# Patient Record
Sex: Female | Born: 1970 | Race: White | Hispanic: No | Marital: Married
Health system: Southern US, Community
[De-identification: ages and names within clinical notes are randomized; demographics above are authoritative.]

## PROBLEM LIST (undated history)

## (undated) HISTORY — PX: BREAST CYST EXCISION: SHX579

---

## 2017-05-12 DIAGNOSIS — H2511 Age-related nuclear cataract, right eye: Secondary | ICD-10-CM | POA: Diagnosis not present

## 2017-05-12 DIAGNOSIS — H25011 Cortical age-related cataract, right eye: Secondary | ICD-10-CM | POA: Diagnosis not present

## 2017-05-12 DIAGNOSIS — H2513 Age-related nuclear cataract, bilateral: Secondary | ICD-10-CM | POA: Diagnosis not present

## 2017-05-12 DIAGNOSIS — H25043 Posterior subcapsular polar age-related cataract, bilateral: Secondary | ICD-10-CM | POA: Diagnosis not present

## 2017-05-12 DIAGNOSIS — H25013 Cortical age-related cataract, bilateral: Secondary | ICD-10-CM | POA: Diagnosis not present

## 2017-06-19 DIAGNOSIS — H2512 Age-related nuclear cataract, left eye: Secondary | ICD-10-CM | POA: Diagnosis not present

## 2017-06-19 DIAGNOSIS — H2511 Age-related nuclear cataract, right eye: Secondary | ICD-10-CM | POA: Diagnosis not present

## 2017-07-03 DIAGNOSIS — H2512 Age-related nuclear cataract, left eye: Secondary | ICD-10-CM | POA: Diagnosis not present

## 2017-08-20 DIAGNOSIS — Z794 Long term (current) use of insulin: Secondary | ICD-10-CM | POA: Diagnosis not present

## 2017-08-20 DIAGNOSIS — E1040 Type 1 diabetes mellitus with diabetic neuropathy, unspecified: Secondary | ICD-10-CM | POA: Diagnosis not present

## 2017-08-20 DIAGNOSIS — E1065 Type 1 diabetes mellitus with hyperglycemia: Secondary | ICD-10-CM | POA: Diagnosis not present

## 2017-08-23 DIAGNOSIS — Z794 Long term (current) use of insulin: Secondary | ICD-10-CM | POA: Diagnosis not present

## 2017-08-23 DIAGNOSIS — E1065 Type 1 diabetes mellitus with hyperglycemia: Secondary | ICD-10-CM | POA: Diagnosis not present

## 2017-09-16 DIAGNOSIS — E103593 Type 1 diabetes mellitus with proliferative diabetic retinopathy without macular edema, bilateral: Secondary | ICD-10-CM | POA: Diagnosis not present

## 2017-09-16 DIAGNOSIS — H43812 Vitreous degeneration, left eye: Secondary | ICD-10-CM | POA: Diagnosis not present

## 2017-09-16 DIAGNOSIS — H35371 Puckering of macula, right eye: Secondary | ICD-10-CM | POA: Diagnosis not present

## 2017-09-30 DIAGNOSIS — L700 Acne vulgaris: Secondary | ICD-10-CM | POA: Diagnosis not present

## 2017-10-26 DIAGNOSIS — R809 Proteinuria, unspecified: Secondary | ICD-10-CM | POA: Diagnosis not present

## 2017-12-04 DIAGNOSIS — E785 Hyperlipidemia, unspecified: Secondary | ICD-10-CM | POA: Diagnosis not present

## 2017-12-04 DIAGNOSIS — E1065 Type 1 diabetes mellitus with hyperglycemia: Secondary | ICD-10-CM | POA: Diagnosis not present

## 2017-12-04 DIAGNOSIS — E1040 Type 1 diabetes mellitus with diabetic neuropathy, unspecified: Secondary | ICD-10-CM | POA: Diagnosis not present

## 2017-12-04 DIAGNOSIS — Z794 Long term (current) use of insulin: Secondary | ICD-10-CM | POA: Diagnosis not present

## 2018-01-27 DIAGNOSIS — Z794 Long term (current) use of insulin: Secondary | ICD-10-CM | POA: Diagnosis not present

## 2018-01-27 DIAGNOSIS — E1143 Type 2 diabetes mellitus with diabetic autonomic (poly)neuropathy: Secondary | ICD-10-CM | POA: Diagnosis not present

## 2018-01-27 DIAGNOSIS — K3184 Gastroparesis: Secondary | ICD-10-CM | POA: Diagnosis not present

## 2018-02-23 DIAGNOSIS — L309 Dermatitis, unspecified: Secondary | ICD-10-CM | POA: Diagnosis not present

## 2018-03-31 DIAGNOSIS — Z23 Encounter for immunization: Secondary | ICD-10-CM | POA: Diagnosis not present

## 2018-04-29 DIAGNOSIS — E119 Type 2 diabetes mellitus without complications: Secondary | ICD-10-CM | POA: Diagnosis not present

## 2018-06-02 DIAGNOSIS — E1065 Type 1 diabetes mellitus with hyperglycemia: Secondary | ICD-10-CM | POA: Diagnosis not present

## 2018-06-02 DIAGNOSIS — E1040 Type 1 diabetes mellitus with diabetic neuropathy, unspecified: Secondary | ICD-10-CM | POA: Diagnosis not present

## 2018-06-02 DIAGNOSIS — Z794 Long term (current) use of insulin: Secondary | ICD-10-CM | POA: Diagnosis not present

## 2018-07-30 DIAGNOSIS — K3184 Gastroparesis: Secondary | ICD-10-CM | POA: Diagnosis not present

## 2018-07-30 DIAGNOSIS — N183 Chronic kidney disease, stage 3 (moderate): Secondary | ICD-10-CM | POA: Diagnosis not present

## 2018-07-30 DIAGNOSIS — E1040 Type 1 diabetes mellitus with diabetic neuropathy, unspecified: Secondary | ICD-10-CM | POA: Diagnosis not present

## 2018-09-16 DIAGNOSIS — L709 Acne, unspecified: Secondary | ICD-10-CM | POA: Diagnosis not present

## 2019-02-24 DIAGNOSIS — Z794 Long term (current) use of insulin: Secondary | ICD-10-CM | POA: Diagnosis not present

## 2019-02-24 DIAGNOSIS — K3184 Gastroparesis: Secondary | ICD-10-CM | POA: Diagnosis not present

## 2019-02-24 DIAGNOSIS — E1040 Type 1 diabetes mellitus with diabetic neuropathy, unspecified: Secondary | ICD-10-CM | POA: Diagnosis not present

## 2019-02-24 DIAGNOSIS — E10319 Type 1 diabetes mellitus with unspecified diabetic retinopathy without macular edema: Secondary | ICD-10-CM | POA: Diagnosis not present

## 2019-03-01 DIAGNOSIS — E1040 Type 1 diabetes mellitus with diabetic neuropathy, unspecified: Secondary | ICD-10-CM | POA: Diagnosis not present

## 2019-04-15 DIAGNOSIS — M79672 Pain in left foot: Secondary | ICD-10-CM | POA: Diagnosis not present

## 2019-04-28 DIAGNOSIS — Z01419 Encounter for gynecological examination (general) (routine) without abnormal findings: Secondary | ICD-10-CM | POA: Diagnosis not present

## 2019-10-25 DIAGNOSIS — E1040 Type 1 diabetes mellitus with diabetic neuropathy, unspecified: Secondary | ICD-10-CM | POA: Diagnosis not present

## 2019-10-25 DIAGNOSIS — K3184 Gastroparesis: Secondary | ICD-10-CM | POA: Diagnosis not present

## 2019-10-25 DIAGNOSIS — F411 Generalized anxiety disorder: Secondary | ICD-10-CM | POA: Diagnosis not present

## 2019-10-25 DIAGNOSIS — F3341 Major depressive disorder, recurrent, in partial remission: Secondary | ICD-10-CM | POA: Diagnosis not present

## 2019-12-09 DIAGNOSIS — S30860A Insect bite (nonvenomous) of lower back and pelvis, initial encounter: Secondary | ICD-10-CM | POA: Diagnosis not present

## 2020-01-23 ENCOUNTER — Ambulatory Visit
Admission: RE | Admit: 2020-01-23 | Discharge: 2020-01-23 | Disposition: A | Discharge status: Home / Self Care | Payer: BC Managed Care – PPO | Source: Ambulatory Visit | Attending: Internal Medicine | Admitting: Internal Medicine

## 2020-01-23 ENCOUNTER — Other Ambulatory Visit: Payer: Self-pay | Admitting: Internal Medicine

## 2020-01-23 DIAGNOSIS — S92512A Displaced fracture of proximal phalanx of left lesser toe(s), initial encounter for closed fracture: Secondary | ICD-10-CM | POA: Diagnosis not present

## 2020-01-23 DIAGNOSIS — E1040 Type 1 diabetes mellitus with diabetic neuropathy, unspecified: Secondary | ICD-10-CM | POA: Diagnosis not present

## 2020-01-23 DIAGNOSIS — Z794 Long term (current) use of insulin: Secondary | ICD-10-CM | POA: Diagnosis not present

## 2020-01-23 DIAGNOSIS — S99922A Unspecified injury of left foot, initial encounter: Secondary | ICD-10-CM

## 2020-01-23 DIAGNOSIS — K3184 Gastroparesis: Secondary | ICD-10-CM | POA: Diagnosis not present

## 2020-01-23 DIAGNOSIS — E10319 Type 1 diabetes mellitus with unspecified diabetic retinopathy without macular edema: Secondary | ICD-10-CM | POA: Diagnosis not present

## 2020-02-27 ENCOUNTER — Other Ambulatory Visit: Payer: Self-pay

## 2020-02-27 ENCOUNTER — Ambulatory Visit: Payer: BC Managed Care – PPO | Admitting: Podiatry

## 2020-02-27 ENCOUNTER — Ambulatory Visit (INDEPENDENT_AMBULATORY_CARE_PROVIDER_SITE_OTHER): Payer: BC Managed Care – PPO

## 2020-02-27 ENCOUNTER — Other Ambulatory Visit: Payer: Self-pay | Admitting: Podiatry

## 2020-02-27 DIAGNOSIS — S92501A Displaced unspecified fracture of right lesser toe(s), initial encounter for closed fracture: Secondary | ICD-10-CM | POA: Diagnosis not present

## 2020-02-27 DIAGNOSIS — M7989 Other specified soft tissue disorders: Secondary | ICD-10-CM

## 2020-02-27 DIAGNOSIS — M79672 Pain in left foot: Secondary | ICD-10-CM

## 2020-02-27 DIAGNOSIS — T148XXA Other injury of unspecified body region, initial encounter: Secondary | ICD-10-CM

## 2020-02-27 NOTE — Patient Instructions (Signed)
Toe Fracture A toe fracture is a break in one of the toe bones (phalanges). This may happen if you:  Drop a heavy object on your toe.  Stub your toe.  Twist your toe.  Exercise the same way too much. What are the signs or symptoms? The main symptoms are swelling and pain in the toe. You may also have:  Bruising.  Stiffness.  Numbness.  A change in the way the toe looks.  Broken bones that poke through the skin.  Blood under the toenail. How is this treated? Treatments may include:  Taping the broken toe to a toe that is next to it (buddy taping).  Wearing a shoe that has a wide, rigid sole to protect the toe and to limit its movement.  Wearing a cast.  Surgery. This may be needed if the: ? Pieces of broken bone are out of place. ? Bone pokes through the skin.  Physical therapy. Follow these instructions at home: If you have a shoe:  Wear the shoe as told by your doctor. Remove it only as told by your doctor.  Loosen the shoe if your toes tingle, become numb, or turn cold and blue.  Keep the shoe clean and dry. If you have a cast:  Do not put pressure on any part of the cast until it is fully hardened. This may take a few hours.  Do not stick anything inside the cast to scratch your skin.  Check the skin around the cast every day. Tell your doctor about any concerns.  You may put lotion on dry skin around the edges of the cast.  Do not put lotion on the skin under the cast.  Keep the cast clean and dry. Bathing  Do not take baths, swim, or use a hot tub until your doctor says it is okay. Ask your doctor if you can take showers.  If the shoe or cast is not waterproof: ? Do not let it get wet. ? Cover it with a watertight covering when you take a bath or a shower. Activity  Do not use your foot to support your body weight until your doctor says it is okay.  Use crutches as told by your doctor.  Ask your doctor what activities are safe for you  during recovery.  Avoid activities as told by your doctor.  Do exercises as told by your doctor or therapist. Driving  Do not drive or use heavy machinery while taking pain medicine.  Do not drive while wearing a cast on a foot that you use for driving. Managing pain, stiffness, and swelling   Put ice on the injured area if told by your doctor: ? Put ice in a plastic bag. ? Place a towel between your skin and the bag.  If you have a shoe, remove it as told by your doctor.  If you have a cast, place a towel between your cast and the bag. ? Leave the ice on for 20 minutes, 2-3 times per day.  Raise (elevate) the injured area above the level of your heart while you are sitting or lying down. General instructions  If your toe was taped to a toe that is next to it, follow your doctor's instructions for changing the gauze and tape. Change it more often: ? If the gauze and tape get wet. If this happens, dry the space between the toes. ? If the gauze and tape are too tight and they cause your toe to become pale   or to lose feeling (go numb).  If your doctor did not give you a protective shoe, wear sturdy shoes that support your foot. Your shoes should not: ? Pinch your toes. ? Fit tightly against your toes.  Do not use any tobacco products, including cigarettes, chewing tobacco, or e-cigarettes. These can delay bone healing. If you need help quitting, ask your doctor.  Take medicines only as told by your doctor.  Keep all follow-up visits as told by your doctor. This is important. Contact a doctor if:  Your pain medicine is not helping.  You have a fever.  You notice a bad smell coming from your cast. Get help right away if:  You lose feeling (have numbness) in your toe or foot, and it is getting worse.  Your toe or your foot tingles.  Your toe or your foot gets cold or turns blue.  You have redness or swelling in your toe or foot, and it is getting worse.  You have very  bad pain. Summary  A toe fracture is a break in one of the toe bones.  Use ice and raise your foot. This will help lessen pain and swelling.  Use crutches as told by your doctor. This information is not intended to replace advice given to you by your health care provider. Make sure you discuss any questions you have with your health care provider. Document Revised: 08/06/2017 Document Reviewed: 07/14/2017 Elsevier Patient Education  2020 [employer]  

## 2020-03-02 NOTE — Progress Notes (Signed)
Subjective:   Patient ID: Stacey Lester, female   DOB: 49 y.o.   MRN: [id number]   HPI 49 year old female presents the office today for concerns of left foot pain, swelling in particular she broke her fifth toe about a month ago.  She states that she is having swelling to the fifth toe.  In general she does get intermittent swelling to the left foot and ankle which is been ongoing for last several years.  She does have a history of fracture of the metatarsals 2 through 4 she reports as well to the fifth metatarsal for which she has had surgery.  No swelling to the leg.   Review of Systems  All other systems reviewed and are negative.  No past medical history on file.  No current outpatient medications on file.  Not on File       Objective:  Physical Exam  General: AAO x3, NAD  Dermatological: Skin is warm, dry and supple bilateral. There are no open sores, no preulcerative lesions, no rash or signs of infection present.  Vascular: Dorsalis Pedis artery and Posterior Tibial artery pedal pulses are 2/4 bilateral with immedate capillary fill time. There is no pain with calf compression, swelling, warmth, erythema.   Neruologic: Grossly intact via light touch bilateral  Musculoskeletal: There is edema and mild tenderness to the fifth toe.  There is some edema present left foot compared to contralateral extremity but there is no erythema or warmth.  There is no pain with ankle joint range of motion or subtalar.  Flexor, extensor tendons appear to be intact.  MMT 5/5.  Gait: Unassisted, Nonantalgic.       Assessment:   Fifth toe fracture, intermittent left foot swelling     Plan:  -Treatment options discussed including all alternatives, risks, and complications -Etiology of symptoms were discussed -X-rays were obtained and reviewed with the patient.  Comminuted fracture present of the fifth digit with some evidence of healing. -As point discussed with her to wear stiffer soled  shoe to allow the toe to continue to heal.  Discussed at some point she may need to have surgical intervention if the toe becomes symptomatic long-term. -Regards to swelling I measured in the calf and the ankles is seen bilaterally.  She does note intermittent swelling to the left foot.  This could be resulted in numerous injuries to the foot.  Will allow the fifth toe to heal and further evaluate the swelling.    Vivi Barrack DPM

## 2020-04-30 DIAGNOSIS — Z794 Long term (current) use of insulin: Secondary | ICD-10-CM | POA: Diagnosis not present

## 2020-04-30 DIAGNOSIS — F3341 Major depressive disorder, recurrent, in partial remission: Secondary | ICD-10-CM | POA: Diagnosis not present

## 2020-04-30 DIAGNOSIS — Z0000 Encounter for general adult medical examination without abnormal findings: Secondary | ICD-10-CM | POA: Diagnosis not present

## 2020-04-30 DIAGNOSIS — Z23 Encounter for immunization: Secondary | ICD-10-CM | POA: Diagnosis not present

## 2020-04-30 DIAGNOSIS — Z1211 Encounter for screening for malignant neoplasm of colon: Secondary | ICD-10-CM | POA: Diagnosis not present

## 2020-04-30 DIAGNOSIS — R809 Proteinuria, unspecified: Secondary | ICD-10-CM | POA: Diagnosis not present

## 2020-04-30 DIAGNOSIS — E10319 Type 1 diabetes mellitus with unspecified diabetic retinopathy without macular edema: Secondary | ICD-10-CM | POA: Diagnosis not present

## 2020-05-02 DIAGNOSIS — N183 Chronic kidney disease, stage 3 unspecified: Secondary | ICD-10-CM | POA: Diagnosis not present

## 2020-05-02 DIAGNOSIS — E10319 Type 1 diabetes mellitus with unspecified diabetic retinopathy without macular edema: Secondary | ICD-10-CM | POA: Diagnosis not present

## 2020-05-02 DIAGNOSIS — E785 Hyperlipidemia, unspecified: Secondary | ICD-10-CM | POA: Diagnosis not present

## 2020-05-02 DIAGNOSIS — Z0000 Encounter for general adult medical examination without abnormal findings: Secondary | ICD-10-CM | POA: Diagnosis not present

## 2020-05-14 DIAGNOSIS — N393 Stress incontinence (female) (male): Secondary | ICD-10-CM | POA: Diagnosis not present

## 2020-05-14 DIAGNOSIS — R232 Flushing: Secondary | ICD-10-CM | POA: Diagnosis not present

## 2020-05-14 DIAGNOSIS — Z01419 Encounter for gynecological examination (general) (routine) without abnormal findings: Secondary | ICD-10-CM | POA: Diagnosis not present

## 2020-05-29 DIAGNOSIS — H43812 Vitreous degeneration, left eye: Secondary | ICD-10-CM | POA: Diagnosis not present

## 2020-05-29 DIAGNOSIS — H26493 Other secondary cataract, bilateral: Secondary | ICD-10-CM | POA: Diagnosis not present

## 2020-05-29 DIAGNOSIS — H35371 Puckering of macula, right eye: Secondary | ICD-10-CM | POA: Diagnosis not present

## 2020-05-29 DIAGNOSIS — E103593 Type 1 diabetes mellitus with proliferative diabetic retinopathy without macular edema, bilateral: Secondary | ICD-10-CM | POA: Diagnosis not present

## 2020-06-28 DIAGNOSIS — R0602 Shortness of breath: Secondary | ICD-10-CM | POA: Diagnosis not present

## 2020-06-28 DIAGNOSIS — R0981 Nasal congestion: Secondary | ICD-10-CM | POA: Diagnosis not present

## 2020-09-10 DIAGNOSIS — Z01812 Encounter for preprocedural laboratory examination: Secondary | ICD-10-CM | POA: Diagnosis not present

## 2020-09-13 DIAGNOSIS — K648 Other hemorrhoids: Secondary | ICD-10-CM | POA: Diagnosis not present

## 2020-09-13 DIAGNOSIS — K644 Residual hemorrhoidal skin tags: Secondary | ICD-10-CM | POA: Diagnosis not present

## 2020-09-13 DIAGNOSIS — Z1211 Encounter for screening for malignant neoplasm of colon: Secondary | ICD-10-CM | POA: Diagnosis not present

## 2020-09-13 DIAGNOSIS — K589 Irritable bowel syndrome without diarrhea: Secondary | ICD-10-CM | POA: Diagnosis not present

## 2020-09-13 DIAGNOSIS — K635 Polyp of colon: Secondary | ICD-10-CM | POA: Diagnosis not present

## 2020-09-13 DIAGNOSIS — Q438 Other specified congenital malformations of intestine: Secondary | ICD-10-CM | POA: Diagnosis not present

## 2020-10-02 ENCOUNTER — Other Ambulatory Visit: Payer: Self-pay | Admitting: Obstetrics and Gynecology

## 2020-10-02 DIAGNOSIS — Z1231 Encounter for screening mammogram for malignant neoplasm of breast: Secondary | ICD-10-CM

## 2020-10-11 ENCOUNTER — Ambulatory Visit
Admission: RE | Admit: 2020-10-11 | Discharge: 2020-10-11 | Disposition: A | Discharge status: Home / Self Care | Payer: BC Managed Care – PPO | Source: Ambulatory Visit | Attending: Internal Medicine | Admitting: Internal Medicine

## 2020-10-11 ENCOUNTER — Other Ambulatory Visit: Payer: Self-pay | Admitting: Internal Medicine

## 2020-10-11 DIAGNOSIS — Z794 Long term (current) use of insulin: Secondary | ICD-10-CM | POA: Diagnosis not present

## 2020-10-11 DIAGNOSIS — Z87891 Personal history of nicotine dependence: Secondary | ICD-10-CM

## 2020-10-11 DIAGNOSIS — E10319 Type 1 diabetes mellitus with unspecified diabetic retinopathy without macular edema: Secondary | ICD-10-CM | POA: Diagnosis not present

## 2020-10-11 DIAGNOSIS — R0602 Shortness of breath: Secondary | ICD-10-CM

## 2020-10-11 DIAGNOSIS — E1040 Type 1 diabetes mellitus with diabetic neuropathy, unspecified: Secondary | ICD-10-CM | POA: Diagnosis not present

## 2020-10-11 DIAGNOSIS — K3184 Gastroparesis: Secondary | ICD-10-CM | POA: Diagnosis not present

## 2020-10-15 DIAGNOSIS — E1040 Type 1 diabetes mellitus with diabetic neuropathy, unspecified: Secondary | ICD-10-CM | POA: Diagnosis not present

## 2020-10-15 DIAGNOSIS — M6281 Muscle weakness (generalized): Secondary | ICD-10-CM | POA: Diagnosis not present

## 2020-10-22 DIAGNOSIS — E1040 Type 1 diabetes mellitus with diabetic neuropathy, unspecified: Secondary | ICD-10-CM | POA: Diagnosis not present

## 2020-10-22 DIAGNOSIS — E559 Vitamin D deficiency, unspecified: Secondary | ICD-10-CM | POA: Diagnosis not present

## 2020-10-22 DIAGNOSIS — N183 Chronic kidney disease, stage 3 unspecified: Secondary | ICD-10-CM | POA: Diagnosis not present

## 2020-10-22 DIAGNOSIS — K3184 Gastroparesis: Secondary | ICD-10-CM | POA: Diagnosis not present

## 2020-10-22 DIAGNOSIS — R809 Proteinuria, unspecified: Secondary | ICD-10-CM | POA: Diagnosis not present

## 2020-10-22 DIAGNOSIS — F411 Generalized anxiety disorder: Secondary | ICD-10-CM | POA: Diagnosis not present

## 2020-10-22 DIAGNOSIS — R5383 Other fatigue: Secondary | ICD-10-CM | POA: Diagnosis not present

## 2020-10-22 DIAGNOSIS — F3341 Major depressive disorder, recurrent, in partial remission: Secondary | ICD-10-CM | POA: Diagnosis not present

## 2020-11-21 ENCOUNTER — Other Ambulatory Visit: Payer: Self-pay

## 2020-11-21 ENCOUNTER — Ambulatory Visit
Admission: RE | Admit: 2020-11-21 | Discharge: 2020-11-21 | Disposition: A | Discharge status: Home / Self Care | Payer: BC Managed Care – PPO | Source: Ambulatory Visit | Attending: Obstetrics and Gynecology | Admitting: Obstetrics and Gynecology

## 2020-11-21 DIAGNOSIS — Z1231 Encounter for screening mammogram for malignant neoplasm of breast: Secondary | ICD-10-CM | POA: Diagnosis not present

## 2020-12-27 DIAGNOSIS — H18413 Arcus senilis, bilateral: Secondary | ICD-10-CM | POA: Diagnosis not present

## 2020-12-27 DIAGNOSIS — H26491 Other secondary cataract, right eye: Secondary | ICD-10-CM | POA: Diagnosis not present

## 2020-12-27 DIAGNOSIS — Z961 Presence of intraocular lens: Secondary | ICD-10-CM | POA: Diagnosis not present

## 2020-12-27 DIAGNOSIS — H53032 Strabismic amblyopia, left eye: Secondary | ICD-10-CM | POA: Diagnosis not present

## 2020-12-27 DIAGNOSIS — H26493 Other secondary cataract, bilateral: Secondary | ICD-10-CM | POA: Diagnosis not present

## 2021-01-18 DIAGNOSIS — B370 Candidal stomatitis: Secondary | ICD-10-CM | POA: Diagnosis not present

## 2021-02-11 DIAGNOSIS — U071 COVID-19: Secondary | ICD-10-CM | POA: Diagnosis not present

## 2021-02-11 DIAGNOSIS — E785 Hyperlipidemia, unspecified: Secondary | ICD-10-CM | POA: Diagnosis not present

## 2021-02-11 DIAGNOSIS — E1040 Type 1 diabetes mellitus with diabetic neuropathy, unspecified: Secondary | ICD-10-CM | POA: Diagnosis not present

## 2021-04-01 DIAGNOSIS — K1230 Oral mucositis (ulcerative), unspecified: Secondary | ICD-10-CM | POA: Diagnosis not present

## 2021-05-15 DIAGNOSIS — N951 Menopausal and female climacteric states: Secondary | ICD-10-CM | POA: Diagnosis not present

## 2021-05-15 DIAGNOSIS — Z01419 Encounter for gynecological examination (general) (routine) without abnormal findings: Secondary | ICD-10-CM | POA: Diagnosis not present

## 2021-05-30 ENCOUNTER — Other Ambulatory Visit: Payer: Self-pay | Admitting: Family Medicine

## 2021-05-30 ENCOUNTER — Other Ambulatory Visit: Payer: Self-pay

## 2021-05-30 ENCOUNTER — Ambulatory Visit
Admission: RE | Admit: 2021-05-30 | Discharge: 2021-05-30 | Disposition: A | Discharge status: Home / Self Care | Payer: BC Managed Care – PPO | Source: Ambulatory Visit | Attending: Family Medicine | Admitting: Family Medicine

## 2021-05-30 DIAGNOSIS — R52 Pain, unspecified: Secondary | ICD-10-CM

## 2021-05-30 DIAGNOSIS — M79672 Pain in left foot: Secondary | ICD-10-CM | POA: Diagnosis not present

## 2021-06-06 DIAGNOSIS — R809 Proteinuria, unspecified: Secondary | ICD-10-CM | POA: Diagnosis not present

## 2021-06-06 DIAGNOSIS — K3184 Gastroparesis: Secondary | ICD-10-CM | POA: Diagnosis not present

## 2021-06-07 ENCOUNTER — Ambulatory Visit (INDEPENDENT_AMBULATORY_CARE_PROVIDER_SITE_OTHER): Payer: BC Managed Care – PPO | Admitting: Podiatry

## 2021-06-07 DIAGNOSIS — S92515A Nondisplaced fracture of proximal phalanx of left lesser toe(s), initial encounter for closed fracture: Secondary | ICD-10-CM

## 2021-06-07 DIAGNOSIS — S93402A Sprain of unspecified ligament of left ankle, initial encounter: Secondary | ICD-10-CM | POA: Diagnosis not present

## 2021-06-12 NOTE — Progress Notes (Signed)
Subjective: 50 year old female presents the office today for concerns of toe injury in which she broke her left fifth toe.  She recently seen at her primary care physician's office and x-rays were performed of the foot which revealed a fracture of the fifth toe.  She has not had any immobilization and she presents today for follow-up.  She does get some discomfort of the ankle.  Objective: AAO x3, NAD DP/PT pulses palpable bilaterally, CRT less than 3 seconds Majority tenderness is localized along the fifth digit of the left foot.  No other discomfort of the digits or metatarsals.  There is a mild discomfort on the distal portion of the fibula and along the lateral ankle complex.  No significant instability present to the ankle at this time.  MMT 5/5. No pain with calf compression, swelling, warmth, erythema  Assessment: Left fifth toe fracture, rule out ankle injury  Plan: -All treatment options discussed with the patient including all alternatives, risks, complications.  -I was able to read the report from the x-rays which revealed a fracture of the fifth toe.  I would order x-rays of the ankle as well given the ankle discomfort.  For now immobilization in cam boot. -Patient encouraged to call the office with any questions, concerns, change in symptoms.   *Appointment there was a power outage and the patient still wanted to be seen.  [facility] was also closed for the holiday could not get x-rays today.  We will then place her into a cam boot and plan to get x-rays next week.  If symptoms worsen let me know.  Vivi Barrack DPM

## 2021-06-25 ENCOUNTER — Other Ambulatory Visit: Payer: Self-pay

## 2021-06-25 ENCOUNTER — Ambulatory Visit (INDEPENDENT_AMBULATORY_CARE_PROVIDER_SITE_OTHER): Payer: BC Managed Care – PPO

## 2021-06-25 ENCOUNTER — Ambulatory Visit: Payer: BC Managed Care – PPO | Admitting: Podiatry

## 2021-06-25 DIAGNOSIS — M7989 Other specified soft tissue disorders: Secondary | ICD-10-CM

## 2021-06-25 DIAGNOSIS — S92515D Nondisplaced fracture of proximal phalanx of left lesser toe(s), subsequent encounter for fracture with routine healing: Secondary | ICD-10-CM

## 2021-06-25 DIAGNOSIS — S92342A Displaced fracture of fourth metatarsal bone, left foot, initial encounter for closed fracture: Secondary | ICD-10-CM

## 2021-06-25 NOTE — Progress Notes (Signed)
Subjective: 51 year old female presents the office today with her husband for follow-up evaluation of a fracture to her big toe, second toe.  She states that she is still in the cam boot.  She has been some tenderness in the ball of her foot as well as the top of her foot she is noticing swelling.  No recent injury or changes otherwise since I last saw her.  Objective: AAO x3, NAD DP/PT pulses palpable bilaterally, CRT less than 3 seconds Mild discomfort present to the hallux as well as the second toe.  The majority discomfort is mostly along the MPJs and along the lesser MPJ on the dorsal aspect of this area.  There is mild edema present to the dorsal forefoot there is no associated erythema or warmth.  There is no pain in the medial ankle.  No discomfort along the ATFL lateral ankle.  No significant increase in anterior drawer or talar tilt test.  Flexor, extensor tendons appear to be intact.  No pain with calf compression, swelling, warmth, erythema  Assessment: Metatarsal fractures, digital fractures left foot; ankle sprain  Plan: -All treatment options discussed with the patient including all alternatives, risks, complications.  -X-rays obtained and reviewed.  Fracture still noted on the hallux as well as second digit.  There is evidence of fracture to the second, fourth metatarsal necks.  No evidence of fracture to the ankle. -Given continued discomfort as well as x-ray findings recommended continued mobilization in cam boot.  Continue to ice and elevate. -Patient encouraged to call the office with any questions, concerns, change in symptoms.   Trula Slade DPM

## 2021-07-11 ENCOUNTER — Ambulatory Visit: Payer: BC Managed Care – PPO

## 2021-07-11 ENCOUNTER — Other Ambulatory Visit: Payer: Self-pay

## 2021-07-11 ENCOUNTER — Ambulatory Visit: Payer: BC Managed Care – PPO | Admitting: Podiatry

## 2021-07-11 DIAGNOSIS — S92515D Nondisplaced fracture of proximal phalanx of left lesser toe(s), subsequent encounter for fracture with routine healing: Secondary | ICD-10-CM | POA: Diagnosis not present

## 2021-07-11 DIAGNOSIS — S92345D Nondisplaced fracture of fourth metatarsal bone, left foot, subsequent encounter for fracture with routine healing: Secondary | ICD-10-CM | POA: Diagnosis not present

## 2021-07-11 DIAGNOSIS — M25572 Pain in left ankle and joints of left foot: Secondary | ICD-10-CM

## 2021-07-14 NOTE — Progress Notes (Signed)
Subjective: 51 year old female presents the office today for evaluation of fracture to her right toes, metatarsals.  She can wear the cam boot.  Can get some swelling still in some discomfort but overall she states that she is doing better.  Pain level is 1/10.  She does not wear the cam boot all the time.  No recent injury or changes otherwise since I last saw her.  Objective: AAO x3, NAD DP/PT pulses palpable bilaterally, CRT less than 3 seconds On today's exam unable to elicit any area of pinpoint tenderness.  Some mild edema still present.  Flexor, extensor tendons appear to be intact.  There is no significant pain of the ankle.  No pain to the lateral ankle complex and there is no instability noted.  MMT 5/5. No pain with calf compression, swelling, warmth, erythema  Assessment: Metatarsal, digital fractures left foot  Plan: -All treatment options discussed with the patient including all alternatives, risks, complications.  -Repeat x-rays obtained reviewed.  Healing fractures noted to the second and third metatarsal necks.  Fraction of the second digit. -At this point I remain in the cam boot but as she has no pain she can start to transition gradually into regular shoe as tolerated.  Continue with compression to help with any swelling.  Continue ice it as well. -Patient encouraged to call the office with any questions, concerns, change in symptoms.   Return in about 4 weeks (around 08/08/2021) for fracture left foot, x-ray.  Vivi Barrack DPM

## 2021-07-18 DIAGNOSIS — N951 Menopausal and female climacteric states: Secondary | ICD-10-CM | POA: Diagnosis not present

## 2021-08-09 ENCOUNTER — Ambulatory Visit: Payer: BC Managed Care – PPO | Admitting: Podiatry

## 2021-10-15 DIAGNOSIS — H4312 Vitreous hemorrhage, left eye: Secondary | ICD-10-CM | POA: Diagnosis not present

## 2021-10-15 DIAGNOSIS — E103593 Type 1 diabetes mellitus with proliferative diabetic retinopathy without macular edema, bilateral: Secondary | ICD-10-CM | POA: Diagnosis not present

## 2021-10-15 DIAGNOSIS — H26493 Other secondary cataract, bilateral: Secondary | ICD-10-CM | POA: Diagnosis not present

## 2021-10-15 DIAGNOSIS — E1040 Type 1 diabetes mellitus with diabetic neuropathy, unspecified: Secondary | ICD-10-CM | POA: Diagnosis not present

## 2021-10-15 DIAGNOSIS — H35371 Puckering of macula, right eye: Secondary | ICD-10-CM | POA: Diagnosis not present

## 2021-10-15 DIAGNOSIS — Z794 Long term (current) use of insulin: Secondary | ICD-10-CM | POA: Diagnosis not present

## 2021-10-15 DIAGNOSIS — K3184 Gastroparesis: Secondary | ICD-10-CM | POA: Diagnosis not present

## 2021-10-17 DIAGNOSIS — E113592 Type 2 diabetes mellitus with proliferative diabetic retinopathy without macular edema, left eye: Secondary | ICD-10-CM | POA: Diagnosis not present

## 2021-10-17 DIAGNOSIS — H4312 Vitreous hemorrhage, left eye: Secondary | ICD-10-CM | POA: Diagnosis not present

## 2021-10-17 DIAGNOSIS — E103592 Type 1 diabetes mellitus with proliferative diabetic retinopathy without macular edema, left eye: Secondary | ICD-10-CM | POA: Diagnosis not present

## 2021-10-17 DIAGNOSIS — H26492 Other secondary cataract, left eye: Secondary | ICD-10-CM | POA: Diagnosis not present

## 2021-10-18 DIAGNOSIS — E103592 Type 1 diabetes mellitus with proliferative diabetic retinopathy without macular edema, left eye: Secondary | ICD-10-CM | POA: Diagnosis not present

## 2021-10-18 DIAGNOSIS — H31092 Other chorioretinal scars, left eye: Secondary | ICD-10-CM | POA: Diagnosis not present

## 2021-10-25 DIAGNOSIS — E103592 Type 1 diabetes mellitus with proliferative diabetic retinopathy without macular edema, left eye: Secondary | ICD-10-CM | POA: Diagnosis not present

## 2021-10-25 DIAGNOSIS — H31092 Other chorioretinal scars, left eye: Secondary | ICD-10-CM | POA: Diagnosis not present

## 2021-10-25 DIAGNOSIS — H4312 Vitreous hemorrhage, left eye: Secondary | ICD-10-CM | POA: Diagnosis not present

## 2021-10-28 DIAGNOSIS — R0781 Pleurodynia: Secondary | ICD-10-CM | POA: Diagnosis not present

## 2021-10-28 DIAGNOSIS — K3184 Gastroparesis: Secondary | ICD-10-CM | POA: Diagnosis not present

## 2021-10-29 DIAGNOSIS — N951 Menopausal and female climacteric states: Secondary | ICD-10-CM | POA: Diagnosis not present

## 2022-01-23 DIAGNOSIS — N951 Menopausal and female climacteric states: Secondary | ICD-10-CM | POA: Diagnosis not present

## 2022-04-03 DIAGNOSIS — N951 Menopausal and female climacteric states: Secondary | ICD-10-CM | POA: Diagnosis not present

## 2022-05-19 DIAGNOSIS — Z01419 Encounter for gynecological examination (general) (routine) without abnormal findings: Secondary | ICD-10-CM | POA: Diagnosis not present

## 2022-05-23 DIAGNOSIS — F411 Generalized anxiety disorder: Secondary | ICD-10-CM | POA: Diagnosis not present

## 2022-05-23 DIAGNOSIS — Z0000 Encounter for general adult medical examination without abnormal findings: Secondary | ICD-10-CM | POA: Diagnosis not present

## 2022-05-23 DIAGNOSIS — K3184 Gastroparesis: Secondary | ICD-10-CM | POA: Diagnosis not present

## 2022-05-23 DIAGNOSIS — E785 Hyperlipidemia, unspecified: Secondary | ICD-10-CM | POA: Diagnosis not present

## 2022-05-23 DIAGNOSIS — E559 Vitamin D deficiency, unspecified: Secondary | ICD-10-CM | POA: Diagnosis not present

## 2022-05-23 DIAGNOSIS — N1831 Chronic kidney disease, stage 3a: Secondary | ICD-10-CM | POA: Diagnosis not present

## 2022-05-23 DIAGNOSIS — F3341 Major depressive disorder, recurrent, in partial remission: Secondary | ICD-10-CM | POA: Diagnosis not present

## 2022-05-23 DIAGNOSIS — E1040 Type 1 diabetes mellitus with diabetic neuropathy, unspecified: Secondary | ICD-10-CM | POA: Diagnosis not present

## 2022-07-24 DIAGNOSIS — E103593 Type 1 diabetes mellitus with proliferative diabetic retinopathy without macular edema, bilateral: Secondary | ICD-10-CM | POA: Diagnosis not present

## 2022-07-24 DIAGNOSIS — H35373 Puckering of macula, bilateral: Secondary | ICD-10-CM | POA: Diagnosis not present

## 2022-07-24 DIAGNOSIS — H59813 Chorioretinal scars after surgery for detachment, bilateral: Secondary | ICD-10-CM | POA: Diagnosis not present

## 2022-08-15 DIAGNOSIS — M79644 Pain in right finger(s): Secondary | ICD-10-CM | POA: Diagnosis not present

## 2022-11-04 DIAGNOSIS — M79644 Pain in right finger(s): Secondary | ICD-10-CM | POA: Diagnosis not present

## 2022-12-19 DIAGNOSIS — M65311 Trigger thumb, right thumb: Secondary | ICD-10-CM | POA: Diagnosis not present

## 2022-12-19 DIAGNOSIS — S62501A Fracture of unspecified phalanx of right thumb, initial encounter for closed fracture: Secondary | ICD-10-CM | POA: Diagnosis not present

## 2023-02-05 DIAGNOSIS — Z794 Long term (current) use of insulin: Secondary | ICD-10-CM | POA: Diagnosis not present

## 2023-02-05 DIAGNOSIS — E1040 Type 1 diabetes mellitus with diabetic neuropathy, unspecified: Secondary | ICD-10-CM | POA: Diagnosis not present

## 2023-02-05 DIAGNOSIS — K3184 Gastroparesis: Secondary | ICD-10-CM | POA: Diagnosis not present

## 2023-04-27 DIAGNOSIS — H59813 Chorioretinal scars after surgery for detachment, bilateral: Secondary | ICD-10-CM | POA: Diagnosis not present

## 2023-04-27 DIAGNOSIS — H35373 Puckering of macula, bilateral: Secondary | ICD-10-CM | POA: Diagnosis not present

## 2023-04-27 DIAGNOSIS — E103593 Type 1 diabetes mellitus with proliferative diabetic retinopathy without macular edema, bilateral: Secondary | ICD-10-CM | POA: Diagnosis not present

## 2023-05-08 DIAGNOSIS — E1040 Type 1 diabetes mellitus with diabetic neuropathy, unspecified: Secondary | ICD-10-CM | POA: Diagnosis not present

## 2023-05-08 DIAGNOSIS — R110 Nausea: Secondary | ICD-10-CM | POA: Diagnosis not present

## 2023-05-08 DIAGNOSIS — Z794 Long term (current) use of insulin: Secondary | ICD-10-CM | POA: Diagnosis not present

## 2023-05-08 DIAGNOSIS — K3184 Gastroparesis: Secondary | ICD-10-CM | POA: Diagnosis not present

## 2023-05-22 DIAGNOSIS — Z01419 Encounter for gynecological examination (general) (routine) without abnormal findings: Secondary | ICD-10-CM | POA: Diagnosis not present

## 2023-06-07 IMAGING — MG MM DIGITAL SCREENING BILAT W/ TOMO AND CAD
8 series · 8 of 24 positions shown · non-contrast
Comparison: None.

CLINICAL DATA: Screening.

EXAM:
DIGITAL SCREENING BILATERAL MAMMOGRAM WITH TOMOSYNTHESIS AND CAD
TECHNIQUE: Bilateral screening digital craniocaudal and mediolateral oblique
mammograms were obtained. Bilateral screening digital breast
tomosynthesis was performed. The images were evaluated with
computer-aided detection.

[R CC synth-2D]
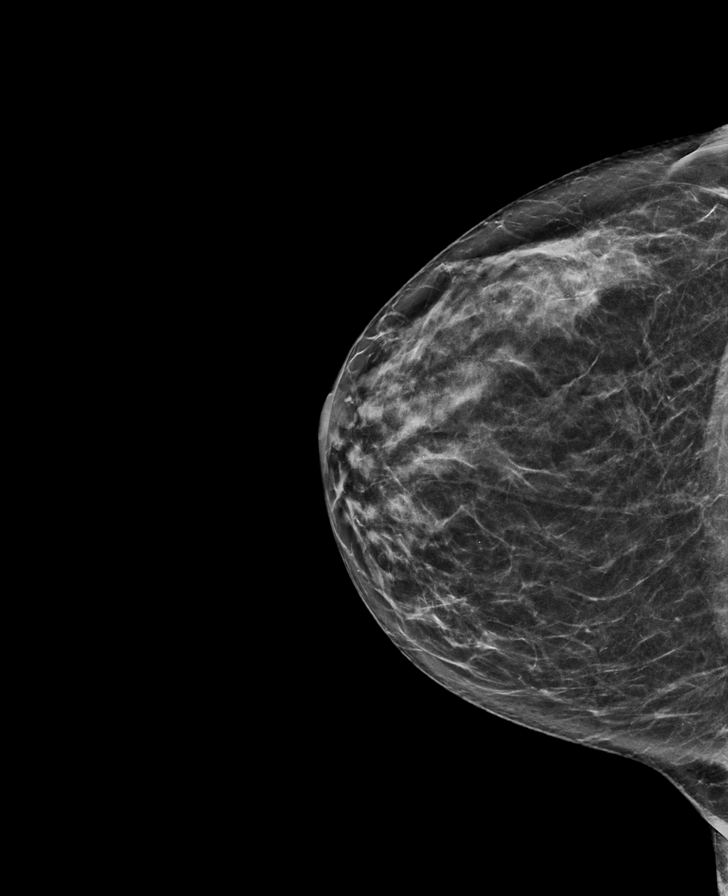

[L CC synth-2D]
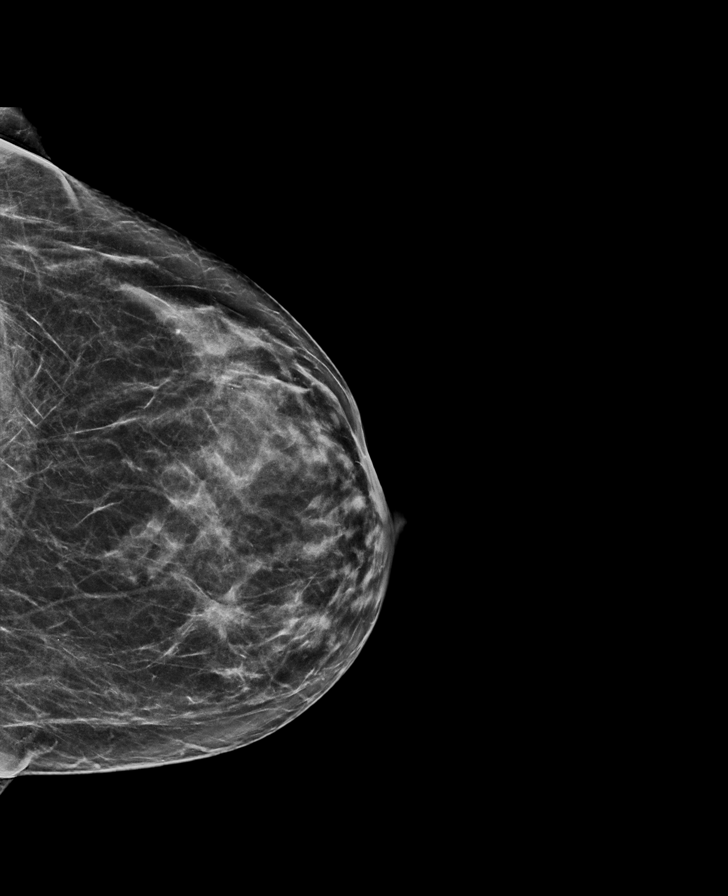

[L MLO synth-2D]
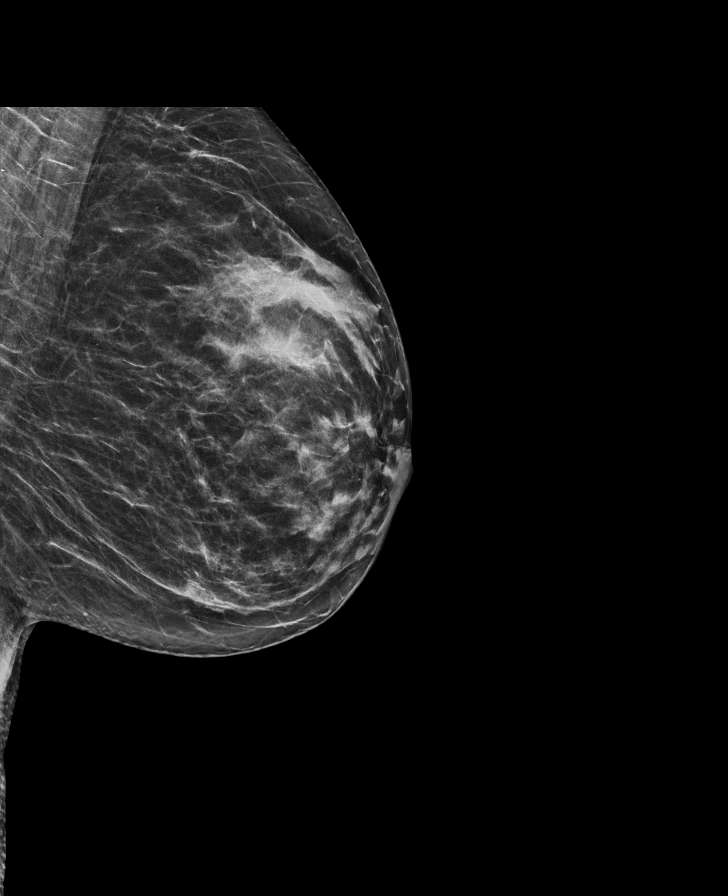

[R MLO synth-2D]
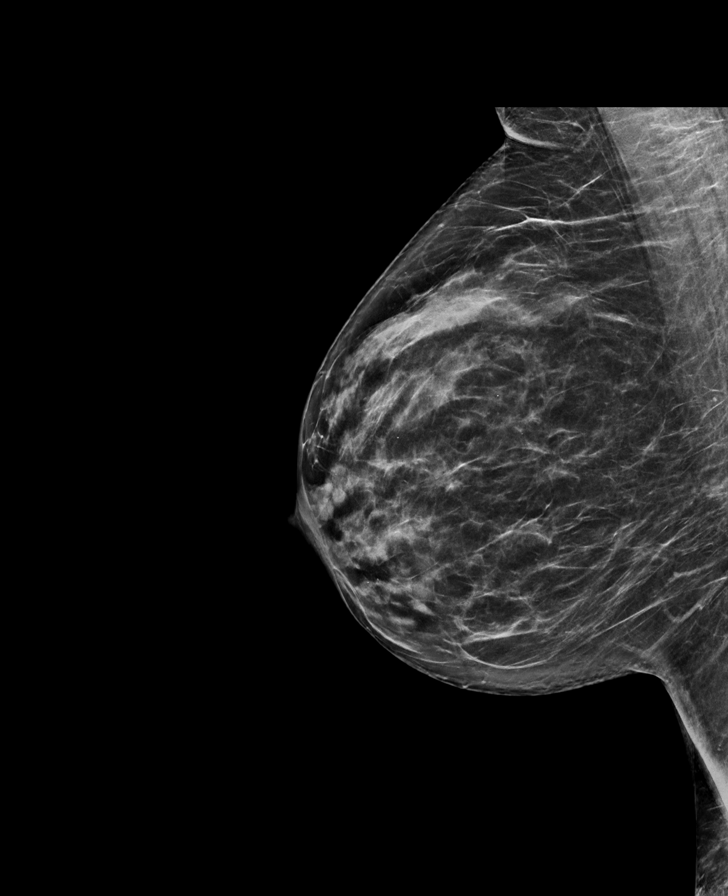

[L CC tomo · tomo slice 35/68.0]
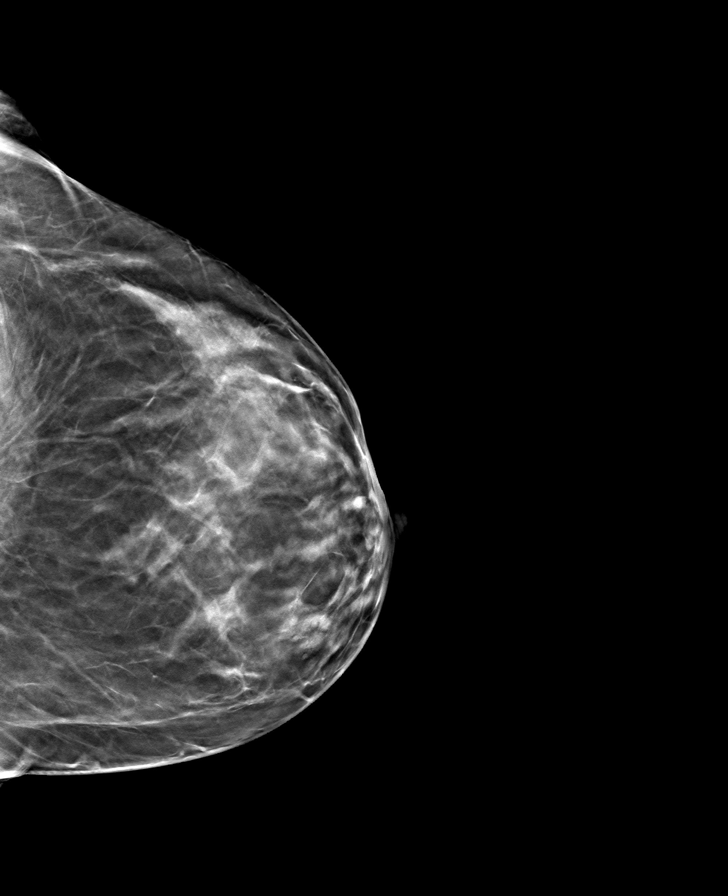

[R CC tomo · tomo slice 34/67.0]
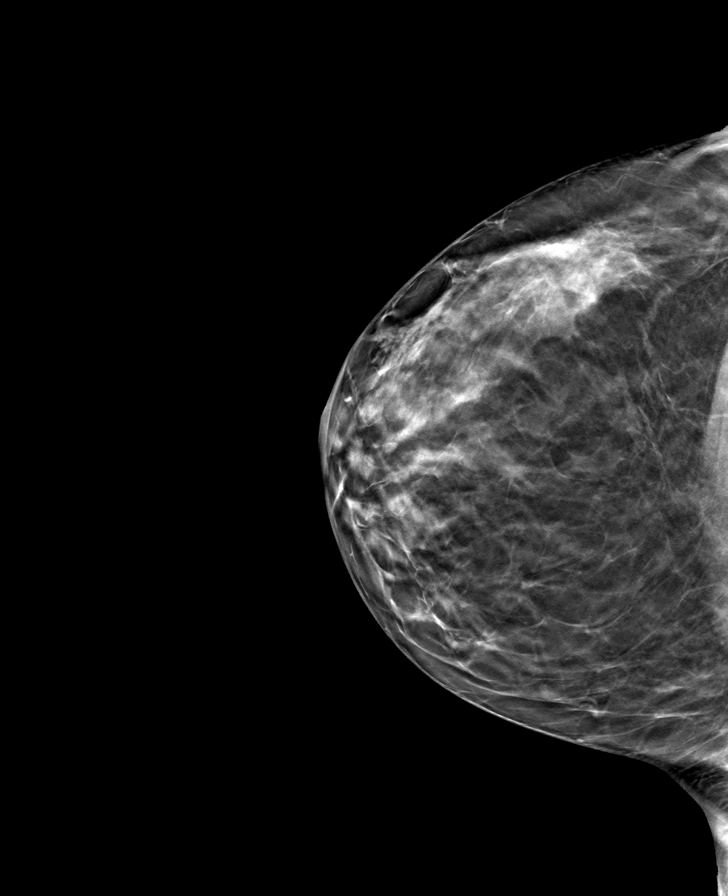

[R MLO tomo · tomo slice 36/71.0]
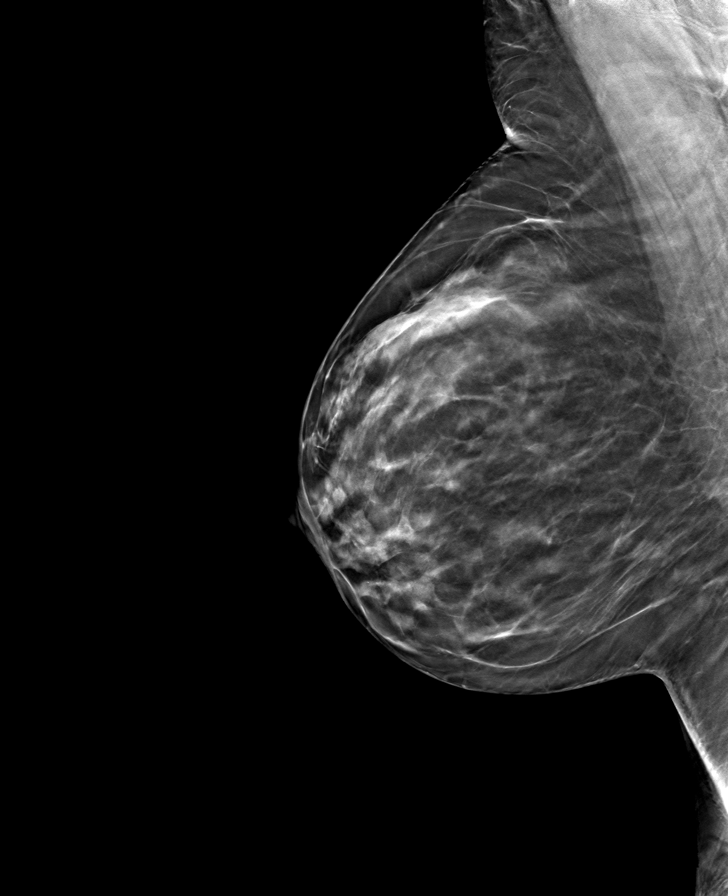

[L MLO tomo · tomo slice 33/65.0]
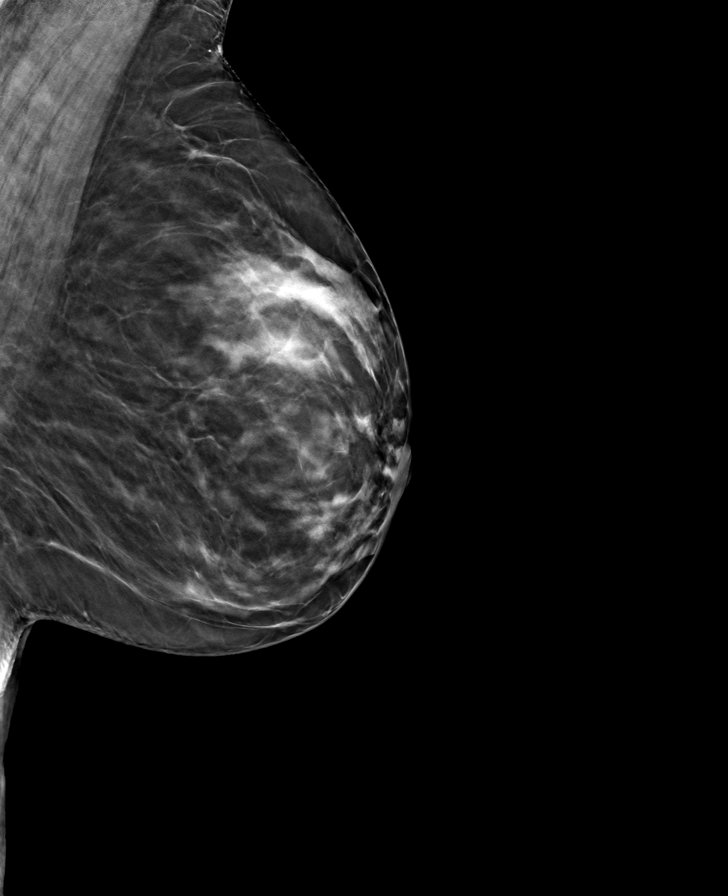

[8 of 24 positions shown; findings below may reference images not displayed]

ACR Breast Density Category c: The breast tissue is heterogeneously
dense, which may obscure small masses
FINDINGS: There are no findings suspicious for malignancy.
IMPRESSION: No mammographic evidence of malignancy. A result letter of this
screening mammogram will be mailed directly to the patient.

RECOMMENDATION:
Screening mammogram in one year. (Code:C8-T-HNK)

BI-RADS CATEGORY  1: Negative.

## 2023-06-18 DIAGNOSIS — F411 Generalized anxiety disorder: Secondary | ICD-10-CM | POA: Diagnosis not present

## 2023-06-18 DIAGNOSIS — R809 Proteinuria, unspecified: Secondary | ICD-10-CM | POA: Diagnosis not present

## 2023-06-18 DIAGNOSIS — Z23 Encounter for immunization: Secondary | ICD-10-CM | POA: Diagnosis not present

## 2023-06-18 DIAGNOSIS — F3341 Major depressive disorder, recurrent, in partial remission: Secondary | ICD-10-CM | POA: Diagnosis not present

## 2023-06-18 DIAGNOSIS — E559 Vitamin D deficiency, unspecified: Secondary | ICD-10-CM | POA: Diagnosis not present

## 2023-06-18 DIAGNOSIS — Z0000 Encounter for general adult medical examination without abnormal findings: Secondary | ICD-10-CM | POA: Diagnosis not present

## 2023-06-18 DIAGNOSIS — K3184 Gastroparesis: Secondary | ICD-10-CM | POA: Diagnosis not present

## 2023-06-18 DIAGNOSIS — N1831 Chronic kidney disease, stage 3a: Secondary | ICD-10-CM | POA: Diagnosis not present

## 2023-06-18 DIAGNOSIS — E1040 Type 1 diabetes mellitus with diabetic neuropathy, unspecified: Secondary | ICD-10-CM | POA: Diagnosis not present

## 2023-06-18 DIAGNOSIS — Z13220 Encounter for screening for lipoid disorders: Secondary | ICD-10-CM | POA: Diagnosis not present

## 2023-06-18 DIAGNOSIS — E785 Hyperlipidemia, unspecified: Secondary | ICD-10-CM | POA: Diagnosis not present

## 2024-03-15 ENCOUNTER — Ambulatory Visit (INDEPENDENT_AMBULATORY_CARE_PROVIDER_SITE_OTHER)

## 2024-03-15 ENCOUNTER — Encounter: Payer: Self-pay | Admitting: Podiatry

## 2024-03-15 ENCOUNTER — Ambulatory Visit: Admitting: Podiatry

## 2024-03-15 DIAGNOSIS — M84374A Stress fracture, right foot, initial encounter for fracture: Secondary | ICD-10-CM

## 2024-03-15 DIAGNOSIS — E559 Vitamin D deficiency, unspecified: Secondary | ICD-10-CM

## 2024-03-15 DIAGNOSIS — M7751 Other enthesopathy of right foot: Secondary | ICD-10-CM

## 2024-03-15 NOTE — Progress Notes (Signed)
  Subjective:  Patient ID: Stacey Lester, female    DOB: 1970/07/28,  MRN: [id number]  Chief Complaint  Patient presents with   Foot Pain    Rm14 Right foot lateral foot pain started 3 weeks ago/ no treatment/ aching and swelling    Discussed the use of AI scribe software for clinical note transcription with the patient, who gave verbal consent to proceed.  History of Present Illness Stacey Lester is a 52 year old female with diabetes who presents with right foot pain and swelling.  She has experienced pain and swelling in her right foot for three weeks without any specific injury. The symptoms began with swelling and pain upon waking. She has been resting the foot and applying ice. Tylenol is used for pain management, avoiding ibuprofen due to diabetes. The pain was more intense initially but has decreased over the last week. There is no pain in the ankle or other areas unless deep pressure is applied. She wears flat shoes to accommodate discomfort.      Objective:  There were no vitals filed for this visit.  Physical Exam General: AAO x3, NAD  Dermatological: Edema present of the right toe but there is no erythema or warmth.  No open lesions.  Vascular: Dorsalis Pedis artery and Posterior Tibial artery pedal pulses are 2/4 bilateral with immedate capillary fill time.  There is no pain with calf compression, swelling, warmth, erythema.   Neruologic: Grossly intact via light touch bilateral.   Musculoskeletal: Tenderness palpation to the lateral aspect of the right foot on the fifth metatarsal.  Flexor, extensor tendons intact.  Gait: Unassisted, Nonantalgic.         Results RADIOLOGY Right foot X-ray: Fifth metatarsal stress fracture with callus formation, indicating healing process.  Concern for possible early stress fracture of the second tarsal.   Assessment:   1. Stress fracture of metatarsal bone of right foot, initial encounter   2. Vitamin D deficiency       Plan:  Patient was evaluated and treated and all questions answered.  Assessment and Plan Assessment & Plan Stress fracture of right fifth metatarsal -Reviewed the x-rays with the patient. - Provide surgical shoe for immobilization. - Advise continued icing and elevation. - Recommend acetaminophen for pain. - Order repeat X-ray in four weeks. - Order vitamin D and blood work.   Return in about 4 weeks (around 04/12/2024) for fracture right foot; x-ray.   Donnice JONELLE Fees DPM

## 2024-03-15 NOTE — Patient Instructions (Signed)
Stress Fracture  A stress fracture is a small break or crack in a bone. A stress fracture can be fully broken (complete) or partially broken (incomplete). The most common sites for stress fractures are the bones in the front of the feet (metatarsals), the heel (calcaneus), and the long bone of the lower leg (tibia). What are the causes? This condition is caused by overuse or repeated exercise, such as running. It happens when a bone cannot absorb any more shock. Repeated stress is put on the bone before the bone and muscles can heal between activities. This leads to a break. Stress fractures happen most commonly when: You quickly increase or start a new physical activity. You use shoes that are worn out or do not fit well. You exercise on a new surface. What increases the risk? You are more likely to develop this condition if: You have weak bones (osteoporosis). You are female. Stress fractures are more likely to occur in females. You take steroid medicines. You have diabetes or rheumatoid arthritis. You have an eating disorder. You have hormone or menstrual problems. You smoke tobacco. What are the signs or symptoms? The most common symptom of a stress fracture is feeling pain when you are using or putting weight on the affected part of your body. The pain usually develops over time. It usually improves when you are resting. Other symptoms may include: Swelling of the affected area. Pain in the area when it is touched. Bruising or redness. How is this diagnosed? This condition may be diagnosed based on: Your symptoms. Your medical history. A physical exam. Imaging tests, such as: X-rays. MRI. Bone scan. How is this treated? Treatment depends on the location of your injury and how severe it is. A stress fracture is often treated with resting, icing, applying pressure (compression), and raising (elevating) the injured area. This is called RICE therapy. Treatment may also  include: Taking NSAIDs, such as ibuprofen, for pain and inflammation. A cast, orthotic boot, or walking shoe. Crutches. You may have to avoid putting weight on the injured leg for a period of time. Surgery. This may be needed if the break is severe, the bone does not heal on its own, and for certain types of fractures. Follow these instructions at home: If you have a nonremovable cast: Do not put pressure on any part of the cast until it is fully hardened. This may take several hours. Do not stick anything inside the cast to scratch your skin. Doing that increases your risk of infection. Check the skin around the cast every day. Tell your health care provider about any concerns. You may put lotion on dry skin around the edges of the cast. Do not put lotion on the skin underneath the cast. Keep the cast clean. If you have a removable walking shoe or boot: Wear the shoe or boot as told by your provider. Remove it only as told by your provider. Do not walk barefoot or wear other types of footwear. Check the skin around the shoe or boot every day. Tell your provider about any concerns. Loosen the shoe or boot if your toes tingle, become numb, or turn cold and blue. Keep the shoe or boot clean. Managing pain, stiffness, and swelling  If told, put ice on the injured area. If you have a walking shoe or boot, remove it as told by your provider. Put ice in a plastic bag. Place a towel between your skin and the bag or between your cast and the  bag. Leave the ice on for 20 minutes, 2-3 times a day. If your skin turns bright red, remove the ice right away to prevent skin damage. The risk of damage is higher if you cannot feel pain, heat, or cold. Move your toes often to reduce stiffness and swelling. Elevate the injured area above the level of your heart while you are sitting or lying down. Activity Rest as told by your provider. Ask your provider if you may do certain exercises, such as swimming or  biking, while you are healing. Return to your normal activities as told by your provider. Ask your provider what activities are safe for you. Do exercises as told by your provider. General instructions Do not use the injured limb to support your body weight until your provider says that you can. Use crutches as told by your provider. Do not use any products that contain nicotine or tobacco. These products include cigarettes, chewing tobacco, and vaping devices, such as e-cigarettes. These can delay bone healing. If you need help quitting, ask your provider. Bathing If the cast, walking shoe, or boot is not waterproof: Do not let it get wet. Cover it with a watertight covering when you take a bath or shower. How is this prevented? Only wear shoes that: Fit well. Are not worn out. Eat a healthy diet that contains vitamin D and calcium. This helps keep your bones strong. Good sources of calcium and vitamin D include: Low-fat dairy products such as milk, yogurt, and cheese. Certain fish, such as fresh or canned salmon, tuna, and sardines. Products that have calcium and vitamin D added to them (fortified products). These can include fortified cereals or juice. Be careful when you start a new physical activity. Give your body time to adjust. Avoid doing only one kind of activity. Do different exercises, such as swimming and running. This helps make sure that no single part of your body gets overused. Do exercises that build strength. Contact a health care provider if: Your pain gets worse. You have new symptoms. Your swelling increases. Get help right away if: You lose feeling in the injured area. This information is not intended to replace advice given to you by your health care provider. Make sure you discuss any questions you have with your health care provider. Document Revised: 04/15/2022 Document Reviewed: 04/15/2022 Elsevier Patient Education  2024 [employer].

## 2024-04-15 ENCOUNTER — Ambulatory Visit: Admitting: Podiatry
